# Patient Record
Sex: Female | Born: 1975 | Race: White | Hispanic: No | Marital: Married | Smoking: Former smoker
Health system: Southern US, Community
[De-identification: ages and names within clinical notes are randomized; demographics above are authoritative.]

## PROBLEM LIST (undated history)

## (undated) HISTORY — PX: NO PAST SURGERIES: SHX2092

---

## 2016-01-12 DIAGNOSIS — R69 Illness, unspecified: Secondary | ICD-10-CM | POA: Diagnosis not present

## 2016-01-12 DIAGNOSIS — E039 Hypothyroidism, unspecified: Secondary | ICD-10-CM | POA: Diagnosis not present

## 2016-04-12 DIAGNOSIS — E039 Hypothyroidism, unspecified: Secondary | ICD-10-CM | POA: Diagnosis not present

## 2016-04-12 DIAGNOSIS — Z23 Encounter for immunization: Secondary | ICD-10-CM | POA: Diagnosis not present

## 2016-04-12 DIAGNOSIS — Z6828 Body mass index (BMI) 28.0-28.9, adult: Secondary | ICD-10-CM | POA: Diagnosis not present

## 2016-04-12 DIAGNOSIS — R69 Illness, unspecified: Secondary | ICD-10-CM | POA: Diagnosis not present

## 2016-04-21 DIAGNOSIS — E063 Autoimmune thyroiditis: Secondary | ICD-10-CM | POA: Diagnosis not present

## 2016-04-21 DIAGNOSIS — R635 Abnormal weight gain: Secondary | ICD-10-CM | POA: Diagnosis not present

## 2016-04-21 DIAGNOSIS — E039 Hypothyroidism, unspecified: Secondary | ICD-10-CM | POA: Diagnosis not present

## 2016-04-21 DIAGNOSIS — R5383 Other fatigue: Secondary | ICD-10-CM | POA: Diagnosis not present

## 2016-04-27 DIAGNOSIS — J029 Acute pharyngitis, unspecified: Secondary | ICD-10-CM | POA: Diagnosis not present

## 2016-05-18 DIAGNOSIS — Z1231 Encounter for screening mammogram for malignant neoplasm of breast: Secondary | ICD-10-CM | POA: Diagnosis not present

## 2016-06-07 DIAGNOSIS — E039 Hypothyroidism, unspecified: Secondary | ICD-10-CM | POA: Diagnosis not present

## 2016-07-14 DIAGNOSIS — R41840 Attention and concentration deficit: Secondary | ICD-10-CM | POA: Diagnosis not present

## 2016-07-14 DIAGNOSIS — E039 Hypothyroidism, unspecified: Secondary | ICD-10-CM | POA: Diagnosis not present

## 2016-07-14 DIAGNOSIS — Z6829 Body mass index (BMI) 29.0-29.9, adult: Secondary | ICD-10-CM | POA: Diagnosis not present

## 2016-09-14 DIAGNOSIS — N920 Excessive and frequent menstruation with regular cycle: Secondary | ICD-10-CM | POA: Diagnosis not present

## 2016-09-14 DIAGNOSIS — R41840 Attention and concentration deficit: Secondary | ICD-10-CM | POA: Diagnosis not present

## 2016-09-14 DIAGNOSIS — E039 Hypothyroidism, unspecified: Secondary | ICD-10-CM | POA: Diagnosis not present

## 2016-11-26 DIAGNOSIS — R300 Dysuria: Secondary | ICD-10-CM | POA: Diagnosis not present

## 2016-11-26 DIAGNOSIS — M545 Low back pain: Secondary | ICD-10-CM | POA: Diagnosis not present

## 2017-02-21 DIAGNOSIS — R300 Dysuria: Secondary | ICD-10-CM | POA: Diagnosis not present

## 2017-02-21 DIAGNOSIS — N390 Urinary tract infection, site not specified: Secondary | ICD-10-CM | POA: Diagnosis not present

## 2017-03-22 DIAGNOSIS — Z20828 Contact with and (suspected) exposure to other viral communicable diseases: Secondary | ICD-10-CM | POA: Diagnosis not present

## 2017-03-22 DIAGNOSIS — R41840 Attention and concentration deficit: Secondary | ICD-10-CM | POA: Diagnosis not present

## 2017-03-22 DIAGNOSIS — R635 Abnormal weight gain: Secondary | ICD-10-CM | POA: Diagnosis not present

## 2017-03-22 DIAGNOSIS — E039 Hypothyroidism, unspecified: Secondary | ICD-10-CM | POA: Diagnosis not present

## 2017-03-22 DIAGNOSIS — W57XXXA Bitten or stung by nonvenomous insect and other nonvenomous arthropods, initial encounter: Secondary | ICD-10-CM | POA: Diagnosis not present

## 2017-04-03 DIAGNOSIS — J069 Acute upper respiratory infection, unspecified: Secondary | ICD-10-CM | POA: Diagnosis not present

## 2017-04-03 DIAGNOSIS — J0141 Acute recurrent pansinusitis: Secondary | ICD-10-CM | POA: Diagnosis not present

## 2017-04-03 DIAGNOSIS — R6889 Other general symptoms and signs: Secondary | ICD-10-CM | POA: Diagnosis not present

## 2017-04-03 DIAGNOSIS — J060 Acute laryngopharyngitis: Secondary | ICD-10-CM | POA: Diagnosis not present

## 2017-04-03 DIAGNOSIS — E039 Hypothyroidism, unspecified: Secondary | ICD-10-CM

## 2017-04-03 DIAGNOSIS — F988 Other specified behavioral and emotional disorders with onset usually occurring in childhood and adolescence: Secondary | ICD-10-CM

## 2017-04-03 HISTORY — DX: Hypothyroidism, unspecified: E03.9

## 2017-04-03 HISTORY — DX: Other specified behavioral and emotional disorders with onset usually occurring in childhood and adolescence: F98.8

## 2017-04-27 DIAGNOSIS — Z23 Encounter for immunization: Secondary | ICD-10-CM | POA: Diagnosis not present

## 2017-04-27 DIAGNOSIS — R41840 Attention and concentration deficit: Secondary | ICD-10-CM | POA: Diagnosis not present

## 2017-04-27 DIAGNOSIS — Z6834 Body mass index (BMI) 34.0-34.9, adult: Secondary | ICD-10-CM | POA: Diagnosis not present

## 2017-04-27 DIAGNOSIS — E039 Hypothyroidism, unspecified: Secondary | ICD-10-CM | POA: Diagnosis not present

## 2017-05-04 DIAGNOSIS — W57XXXA Bitten or stung by nonvenomous insect and other nonvenomous arthropods, initial encounter: Secondary | ICD-10-CM | POA: Diagnosis not present

## 2017-05-04 DIAGNOSIS — E039 Hypothyroidism, unspecified: Secondary | ICD-10-CM | POA: Diagnosis not present

## 2017-05-04 DIAGNOSIS — M2550 Pain in unspecified joint: Secondary | ICD-10-CM | POA: Diagnosis not present

## 2017-05-04 DIAGNOSIS — E559 Vitamin D deficiency, unspecified: Secondary | ICD-10-CM | POA: Diagnosis not present

## 2017-05-04 DIAGNOSIS — M791 Myalgia, unspecified site: Secondary | ICD-10-CM | POA: Diagnosis not present

## 2017-05-04 DIAGNOSIS — R635 Abnormal weight gain: Secondary | ICD-10-CM | POA: Diagnosis not present

## 2017-05-04 DIAGNOSIS — K589 Irritable bowel syndrome without diarrhea: Secondary | ICD-10-CM | POA: Diagnosis not present

## 2017-05-04 DIAGNOSIS — G43909 Migraine, unspecified, not intractable, without status migrainosus: Secondary | ICD-10-CM | POA: Diagnosis not present

## 2017-05-04 DIAGNOSIS — E063 Autoimmune thyroiditis: Secondary | ICD-10-CM | POA: Diagnosis not present

## 2017-05-12 DIAGNOSIS — Z01419 Encounter for gynecological examination (general) (routine) without abnormal findings: Secondary | ICD-10-CM | POA: Diagnosis not present

## 2017-05-12 DIAGNOSIS — N760 Acute vaginitis: Secondary | ICD-10-CM | POA: Diagnosis not present

## 2017-05-12 DIAGNOSIS — Z1331 Encounter for screening for depression: Secondary | ICD-10-CM | POA: Diagnosis not present

## 2017-05-12 DIAGNOSIS — Z1389 Encounter for screening for other disorder: Secondary | ICD-10-CM | POA: Diagnosis not present

## 2017-05-12 DIAGNOSIS — E039 Hypothyroidism, unspecified: Secondary | ICD-10-CM | POA: Diagnosis not present

## 2017-05-12 DIAGNOSIS — Z1211 Encounter for screening for malignant neoplasm of colon: Secondary | ICD-10-CM | POA: Diagnosis not present

## 2017-05-22 DIAGNOSIS — S9032XA Contusion of left foot, initial encounter: Secondary | ICD-10-CM | POA: Diagnosis not present

## 2017-05-22 DIAGNOSIS — S99922A Unspecified injury of left foot, initial encounter: Secondary | ICD-10-CM | POA: Diagnosis not present

## 2017-05-22 DIAGNOSIS — M79672 Pain in left foot: Secondary | ICD-10-CM | POA: Diagnosis not present

## 2017-05-22 DIAGNOSIS — S9782XA Crushing injury of left foot, initial encounter: Secondary | ICD-10-CM | POA: Diagnosis not present

## 2017-05-30 DIAGNOSIS — Z1231 Encounter for screening mammogram for malignant neoplasm of breast: Secondary | ICD-10-CM | POA: Diagnosis not present

## 2017-06-06 DIAGNOSIS — G43909 Migraine, unspecified, not intractable, without status migrainosus: Secondary | ICD-10-CM | POA: Diagnosis not present

## 2017-06-06 DIAGNOSIS — R635 Abnormal weight gain: Secondary | ICD-10-CM | POA: Diagnosis not present

## 2017-06-06 DIAGNOSIS — E039 Hypothyroidism, unspecified: Secondary | ICD-10-CM | POA: Diagnosis not present

## 2017-06-06 DIAGNOSIS — E538 Deficiency of other specified B group vitamins: Secondary | ICD-10-CM | POA: Diagnosis not present

## 2017-06-06 DIAGNOSIS — K589 Irritable bowel syndrome without diarrhea: Secondary | ICD-10-CM | POA: Diagnosis not present

## 2017-06-06 DIAGNOSIS — M2550 Pain in unspecified joint: Secondary | ICD-10-CM | POA: Diagnosis not present

## 2017-06-06 DIAGNOSIS — R7989 Other specified abnormal findings of blood chemistry: Secondary | ICD-10-CM | POA: Diagnosis not present

## 2017-06-06 DIAGNOSIS — M791 Myalgia, unspecified site: Secondary | ICD-10-CM | POA: Diagnosis not present

## 2017-06-06 DIAGNOSIS — W57XXXA Bitten or stung by nonvenomous insect and other nonvenomous arthropods, initial encounter: Secondary | ICD-10-CM | POA: Diagnosis not present

## 2017-06-06 DIAGNOSIS — E063 Autoimmune thyroiditis: Secondary | ICD-10-CM | POA: Diagnosis not present

## 2017-09-05 DIAGNOSIS — E559 Vitamin D deficiency, unspecified: Secondary | ICD-10-CM | POA: Diagnosis not present

## 2017-09-05 DIAGNOSIS — E039 Hypothyroidism, unspecified: Secondary | ICD-10-CM | POA: Diagnosis not present

## 2017-09-05 DIAGNOSIS — R7989 Other specified abnormal findings of blood chemistry: Secondary | ICD-10-CM | POA: Diagnosis not present

## 2017-09-05 DIAGNOSIS — E063 Autoimmune thyroiditis: Secondary | ICD-10-CM | POA: Diagnosis not present

## 2017-09-05 DIAGNOSIS — E538 Deficiency of other specified B group vitamins: Secondary | ICD-10-CM | POA: Diagnosis not present

## 2017-09-05 DIAGNOSIS — M2550 Pain in unspecified joint: Secondary | ICD-10-CM | POA: Diagnosis not present

## 2017-09-05 DIAGNOSIS — M791 Myalgia, unspecified site: Secondary | ICD-10-CM | POA: Diagnosis not present

## 2017-09-05 DIAGNOSIS — K589 Irritable bowel syndrome without diarrhea: Secondary | ICD-10-CM | POA: Diagnosis not present

## 2017-09-05 DIAGNOSIS — G43909 Migraine, unspecified, not intractable, without status migrainosus: Secondary | ICD-10-CM | POA: Diagnosis not present

## 2017-09-22 DIAGNOSIS — R41840 Attention and concentration deficit: Secondary | ICD-10-CM | POA: Diagnosis not present

## 2017-09-22 DIAGNOSIS — G43909 Migraine, unspecified, not intractable, without status migrainosus: Secondary | ICD-10-CM | POA: Diagnosis not present

## 2017-09-22 DIAGNOSIS — Z30432 Encounter for removal of intrauterine contraceptive device: Secondary | ICD-10-CM | POA: Diagnosis not present

## 2017-11-20 DIAGNOSIS — E538 Deficiency of other specified B group vitamins: Secondary | ICD-10-CM | POA: Diagnosis not present

## 2017-11-20 DIAGNOSIS — G43909 Migraine, unspecified, not intractable, without status migrainosus: Secondary | ICD-10-CM | POA: Diagnosis not present

## 2017-11-20 DIAGNOSIS — E039 Hypothyroidism, unspecified: Secondary | ICD-10-CM | POA: Diagnosis not present

## 2017-11-20 DIAGNOSIS — R7989 Other specified abnormal findings of blood chemistry: Secondary | ICD-10-CM | POA: Diagnosis not present

## 2017-11-20 DIAGNOSIS — E559 Vitamin D deficiency, unspecified: Secondary | ICD-10-CM | POA: Diagnosis not present

## 2017-11-20 DIAGNOSIS — E063 Autoimmune thyroiditis: Secondary | ICD-10-CM | POA: Diagnosis not present

## 2017-12-04 DIAGNOSIS — R635 Abnormal weight gain: Secondary | ICD-10-CM | POA: Diagnosis not present

## 2017-12-04 DIAGNOSIS — G43909 Migraine, unspecified, not intractable, without status migrainosus: Secondary | ICD-10-CM | POA: Diagnosis not present

## 2017-12-04 DIAGNOSIS — R7989 Other specified abnormal findings of blood chemistry: Secondary | ICD-10-CM | POA: Diagnosis not present

## 2017-12-04 DIAGNOSIS — W57XXXA Bitten or stung by nonvenomous insect and other nonvenomous arthropods, initial encounter: Secondary | ICD-10-CM | POA: Diagnosis not present

## 2017-12-04 DIAGNOSIS — M791 Myalgia, unspecified site: Secondary | ICD-10-CM | POA: Diagnosis not present

## 2017-12-04 DIAGNOSIS — K589 Irritable bowel syndrome without diarrhea: Secondary | ICD-10-CM | POA: Diagnosis not present

## 2017-12-04 DIAGNOSIS — E538 Deficiency of other specified B group vitamins: Secondary | ICD-10-CM | POA: Diagnosis not present

## 2017-12-04 DIAGNOSIS — E039 Hypothyroidism, unspecified: Secondary | ICD-10-CM | POA: Diagnosis not present

## 2017-12-04 DIAGNOSIS — M2550 Pain in unspecified joint: Secondary | ICD-10-CM | POA: Diagnosis not present

## 2017-12-04 DIAGNOSIS — E063 Autoimmune thyroiditis: Secondary | ICD-10-CM | POA: Diagnosis not present

## 2018-05-16 DIAGNOSIS — Z23 Encounter for immunization: Secondary | ICD-10-CM | POA: Diagnosis not present

## 2018-05-29 DIAGNOSIS — E063 Autoimmune thyroiditis: Secondary | ICD-10-CM | POA: Diagnosis not present

## 2018-05-29 DIAGNOSIS — G43909 Migraine, unspecified, not intractable, without status migrainosus: Secondary | ICD-10-CM | POA: Diagnosis not present

## 2018-05-29 DIAGNOSIS — E039 Hypothyroidism, unspecified: Secondary | ICD-10-CM | POA: Diagnosis not present

## 2018-05-29 DIAGNOSIS — R7989 Other specified abnormal findings of blood chemistry: Secondary | ICD-10-CM | POA: Diagnosis not present

## 2018-05-29 DIAGNOSIS — E538 Deficiency of other specified B group vitamins: Secondary | ICD-10-CM | POA: Diagnosis not present

## 2018-06-06 DIAGNOSIS — E063 Autoimmune thyroiditis: Secondary | ICD-10-CM | POA: Diagnosis not present

## 2018-06-06 DIAGNOSIS — E538 Deficiency of other specified B group vitamins: Secondary | ICD-10-CM | POA: Diagnosis not present

## 2018-06-06 DIAGNOSIS — M791 Myalgia, unspecified site: Secondary | ICD-10-CM | POA: Diagnosis not present

## 2018-06-06 DIAGNOSIS — G43909 Migraine, unspecified, not intractable, without status migrainosus: Secondary | ICD-10-CM | POA: Diagnosis not present

## 2018-06-06 DIAGNOSIS — E039 Hypothyroidism, unspecified: Secondary | ICD-10-CM | POA: Diagnosis not present

## 2018-06-06 DIAGNOSIS — K589 Irritable bowel syndrome without diarrhea: Secondary | ICD-10-CM | POA: Diagnosis not present

## 2018-06-06 DIAGNOSIS — R7989 Other specified abnormal findings of blood chemistry: Secondary | ICD-10-CM | POA: Diagnosis not present

## 2018-06-06 DIAGNOSIS — W57XXXA Bitten or stung by nonvenomous insect and other nonvenomous arthropods, initial encounter: Secondary | ICD-10-CM | POA: Diagnosis not present

## 2018-06-06 DIAGNOSIS — R635 Abnormal weight gain: Secondary | ICD-10-CM | POA: Diagnosis not present

## 2018-06-06 DIAGNOSIS — M2550 Pain in unspecified joint: Secondary | ICD-10-CM | POA: Diagnosis not present

## 2018-08-10 DIAGNOSIS — N912 Amenorrhea, unspecified: Secondary | ICD-10-CM | POA: Diagnosis not present

## 2018-08-10 DIAGNOSIS — N926 Irregular menstruation, unspecified: Secondary | ICD-10-CM | POA: Diagnosis not present

## 2018-08-10 DIAGNOSIS — E559 Vitamin D deficiency, unspecified: Secondary | ICD-10-CM | POA: Diagnosis not present

## 2018-08-10 DIAGNOSIS — R41840 Attention and concentration deficit: Secondary | ICD-10-CM | POA: Diagnosis not present

## 2018-08-10 DIAGNOSIS — Z13220 Encounter for screening for lipoid disorders: Secondary | ICD-10-CM | POA: Diagnosis not present

## 2019-04-30 ENCOUNTER — Ambulatory Visit
Admission: RE | Admit: 2019-04-30 | Discharge: 2019-04-30 | Disposition: A | Discharge status: Home / Self Care | Payer: 59 | Source: Ambulatory Visit | Attending: Family Medicine | Admitting: Family Medicine

## 2019-04-30 ENCOUNTER — Other Ambulatory Visit: Payer: Self-pay

## 2019-04-30 DIAGNOSIS — R509 Fever, unspecified: Secondary | ICD-10-CM

## 2020-02-28 ENCOUNTER — Emergency Department (HOSPITAL_COMMUNITY): Payer: 59

## 2020-02-28 ENCOUNTER — Emergency Department (HOSPITAL_COMMUNITY)
Admission: EM | Admit: 2020-02-28 | Discharge: 2020-02-29 | Disposition: A | Discharge status: Home / Self Care | Payer: 59 | Attending: Emergency Medicine | Admitting: Emergency Medicine

## 2020-02-28 ENCOUNTER — Other Ambulatory Visit: Payer: Self-pay

## 2020-02-28 ENCOUNTER — Encounter (HOSPITAL_COMMUNITY): Payer: Self-pay

## 2020-02-28 DIAGNOSIS — Z7982 Long term (current) use of aspirin: Secondary | ICD-10-CM | POA: Insufficient documentation

## 2020-02-28 DIAGNOSIS — R0602 Shortness of breath: Secondary | ICD-10-CM | POA: Diagnosis not present

## 2020-02-28 DIAGNOSIS — Z79899 Other long term (current) drug therapy: Secondary | ICD-10-CM | POA: Diagnosis not present

## 2020-02-28 DIAGNOSIS — R600 Localized edema: Secondary | ICD-10-CM | POA: Diagnosis not present

## 2020-02-28 DIAGNOSIS — Z86711 Personal history of pulmonary embolism: Secondary | ICD-10-CM | POA: Diagnosis not present

## 2020-02-28 DIAGNOSIS — R2243 Localized swelling, mass and lump, lower limb, bilateral: Secondary | ICD-10-CM | POA: Diagnosis present

## 2020-02-28 DIAGNOSIS — R791 Abnormal coagulation profile: Secondary | ICD-10-CM | POA: Insufficient documentation

## 2020-02-28 LAB — I-STAT CHEM 8, ED
BUN: 16 mg/dL (ref 6–20)
Calcium, Ion: 1.22 mmol/L (ref 1.15–1.40)
Chloride: 103 mmol/L (ref 98–111)
Creatinine, Ser: 1.2 mg/dL — ABNORMAL HIGH (ref 0.44–1.00)
Glucose, Bld: 100 mg/dL — ABNORMAL HIGH (ref 70–99)
HCT: 33 % — ABNORMAL LOW (ref 36.0–46.0)
Hemoglobin: 11.2 g/dL — ABNORMAL LOW (ref 12.0–15.0)
Potassium: 3.5 mmol/L (ref 3.5–5.1)
Sodium: 140 mmol/L (ref 135–145)
TCO2: 22 mmol/L (ref 22–32)

## 2020-02-28 LAB — CBC
HCT: 36.3 % (ref 36.0–46.0)
Hemoglobin: 11.2 g/dL — ABNORMAL LOW (ref 12.0–15.0)
MCH: 26.2 pg (ref 26.0–34.0)
MCHC: 30.9 g/dL (ref 30.0–36.0)
MCV: 85 fL (ref 80.0–100.0)
Platelets: 440 10*3/uL — ABNORMAL HIGH (ref 150–400)
RBC: 4.27 MIL/uL (ref 3.87–5.11)
RDW: 13.9 % (ref 11.5–15.5)
WBC: 8.9 10*3/uL (ref 4.0–10.5)
nRBC: 0 % (ref 0.0–0.2)

## 2020-02-28 LAB — COMPREHENSIVE METABOLIC PANEL
ALT: 12 U/L (ref 0–44)
AST: 22 U/L (ref 15–41)
Albumin: 2.6 g/dL — ABNORMAL LOW (ref 3.5–5.0)
Alkaline Phosphatase: 77 U/L (ref 38–126)
Anion gap: 7 (ref 5–15)
BUN: 15 mg/dL (ref 6–20)
CO2: 24 mmol/L (ref 22–32)
Calcium: 8.7 mg/dL — ABNORMAL LOW (ref 8.9–10.3)
Chloride: 107 mmol/L (ref 98–111)
Creatinine, Ser: 1.3 mg/dL — ABNORMAL HIGH (ref 0.44–1.00)
GFR calc Af Amer: 57 mL/min — ABNORMAL LOW (ref >60)
GFR calc non Af Amer: 50 mL/min — ABNORMAL LOW (ref >60)
Glucose, Bld: 106 mg/dL — ABNORMAL HIGH (ref 70–99)
Potassium: 3.6 mmol/L (ref 3.5–5.1)
Sodium: 138 mmol/L (ref 135–145)
Total Bilirubin: 0.8 mg/dL (ref 0.3–1.2)
Total Protein: 5 g/dL — ABNORMAL LOW (ref 6.5–8.1)

## 2020-02-28 LAB — I-STAT BETA HCG BLOOD, ED (MC, WL, AP ONLY): I-stat hCG, quantitative: <5 m[IU]/mL (ref <5)

## 2020-02-28 MED ORDER — IOHEXOL 350 MG/ML SOLN
75.0000 mL | Freq: Once | INTRAVENOUS | Status: AC | PRN
  Administered 2020-02-28: 75 mL via INTRAVENOUS

## 2020-02-28 NOTE — ED Triage Notes (Signed)
Pt arrives POV for eval of SOB, blt LE edema x3-4 days. Seen at PCP yesterday and given diuretic, PCP called today and advised positive D- dimer of 2.69. No hx of blood clots/DVTs.

## 2020-02-29 ENCOUNTER — Emergency Department (HOSPITAL_BASED_OUTPATIENT_CLINIC_OR_DEPARTMENT_OTHER): Payer: 59

## 2020-02-29 ENCOUNTER — Encounter (HOSPITAL_COMMUNITY): Payer: 59

## 2020-02-29 DIAGNOSIS — M7989 Other specified soft tissue disorders: Secondary | ICD-10-CM | POA: Diagnosis not present

## 2020-02-29 LAB — BRAIN NATRIURETIC PEPTIDE: B Natriuretic Peptide: 21.3 pg/mL (ref 0.0–100.0)

## 2020-02-29 MED ORDER — FUROSEMIDE 10 MG/ML IJ SOLN
40.0000 mg | Freq: Once | INTRAMUSCULAR | Status: AC
  Administered 2020-02-29: 40 mg via INTRAVENOUS
  Filled 2020-02-29: qty 4

## 2020-02-29 NOTE — Discharge Instructions (Signed)
Begin taking the Lasix as prescribed by your primary doctor.  Follow-up with cardiology in the next week.  The contact information for the cardiology clinic on [facility] has been provided in this discharge summary for you to call and make these arrangements.  Return to the ER if you develop chest pain, worsening breathing, high fever, or other new and concerning symptoms.

## 2020-02-29 NOTE — Progress Notes (Signed)
VASCULAR LAB    Bilateral lower extremity venous duplex completed.    Preliminary report:  See CV proc for preliminary results.  Messaged Dr. Judd Lien with results.  Teal Bontrager, RVT 02/29/2020, 8:22 AM

## 2020-02-29 NOTE — ED Provider Notes (Signed)
[facility] EMERGENCY DEPARTMENT Provider Note   CSN: [id number] Arrival date & time: 02/28/20  1739     History Chief Complaint  Patient presents with   Shortness of Breath   Abnormal Lab    Hayley Mason is a 44 y.o. female.  Patient is a 44 year old female with no significant past medical history.  She presents today for evaluation of bilateral leg swelling.  This has been worsening over the past several days.  She was seen by her primary doctor yesterday and prescribed furosemide and had laboratory studies obtained.  Patient was found to have an elevated D-dimer and was referred here for further work-up.  Patient denies prior history of DVT or PE.  She denies chest pain but does feel somewhat short of breath.  The history is provided by the patient.       History reviewed. No pertinent past medical history.  There are no problems to display for this patient.     OB History   No obstetric history on file.     History reviewed. No pertinent family history.  Social History   Tobacco Use   Smoking status: Not on file  Substance Use Topics   Alcohol use: Not on file   Drug use: Not on file    Home Medications Prior to Admission medications   Medication Sig Start Date End Date Taking? Authorizing Provider  acetaminophen (TYLENOL) 500 MG tablet Take 500 mg by mouth every 6 (six) hours as needed for mild pain.   Yes [provider]  Acetylcysteine 600 MG CAPS Take 1 capsule by mouth every other day.   Yes [provider]  amphetamine-dextroamphetamine (ADDERALL) 20 MG tablet Take 20 mg by mouth 2 (two) times daily.   Yes [provider]  aspirin EC 81 MG tablet Take 81 mg by mouth daily. Swallow whole.   Yes [provider]  cetirizine (ZYRTEC) 10 MG tablet Take 10 mg by mouth every evening.   Yes [provider]  diphenhydrAMINE (BENADRYL) 25 MG tablet Take 25 mg by mouth every 6 (six) hours as  needed (Allergic reaction).   Yes [provider]  ibuprofen (ADVIL) 200 MG tablet Take 200 mg by mouth every 6 (six) hours as needed for moderate pain.   Yes [provider]  Naltrexone POWD Take 1 tablet by mouth every evening. 2 mg tablet per patient   Yes [provider]  thyroid (ARMOUR) 90 MG tablet Take 90 mg by mouth daily.   Yes [provider]  topiramate (TOPAMAX) 25 MG capsule Take 25 mg by mouth every evening.   Yes [provider]  Vitamin D-Vitamin K (VITAMIN K2-VITAMIN D3) 45-2000 MCG-UNIT CAPS Take 1 tablet by mouth every evening.   Yes [provider]    Allergies    Codeine  Review of Systems   Review of Systems  All other systems reviewed and are negative.   Physical Exam Updated Vital Signs BP 96/63 (BP Location: Left Arm)    Pulse 90    Temp 98.5 F (36.9 C) (Oral)    Resp 16    Ht 5\' 3"  (1.6 m)    Wt 90.7 kg    SpO2 100%    BMI 35.43 kg/m   Physical Exam Vitals and nursing note reviewed.  Constitutional:      General: She is not in acute distress.    Appearance: She is well-developed. She is not diaphoretic.  HENT:  Head: Normocephalic and atraumatic.  Cardiovascular:     Rate and Rhythm: Normal rate and regular rhythm.     Heart sounds: No murmur heard.  No friction rub. No gallop.   Pulmonary:     Effort: Pulmonary effort is normal. No respiratory distress.     Breath sounds: Normal breath sounds. No wheezing.  Abdominal:     General: Bowel sounds are normal. There is no distension.     Palpations: Abdomen is soft.     Tenderness: There is no abdominal tenderness.  Musculoskeletal:        General: Normal range of motion.     Cervical back: Normal range of motion and neck supple.     Right lower leg: Edema present.     Left lower leg: Edema present.     Comments: There is 1-2+ pitting edema of both lower extremities.  There is no calf tenderness.  Denna Haggard' sign is absent.  Skin:    General:  Skin is warm and dry.  Neurological:     Mental Status: She is alert and oriented to person, place, and time.     ED Results / Procedures / Treatments   Labs (all labs ordered are listed, but only abnormal results are displayed) Labs Reviewed  CBC - Abnormal; Notable for the following components:      Result Value   Hemoglobin 11.2 (*)    Platelets 440 (*)    All other components within normal limits  COMPREHENSIVE METABOLIC PANEL - Abnormal; Notable for the following components:   Glucose, Bld 106 (*)    Creatinine, Ser 1.30 (*)    Calcium 8.7 (*)    Total Protein 5.0 (*)    Albumin 2.6 (*)    GFR calc non Af Amer 50 (*)    GFR calc Af Amer 57 (*)    All other components within normal limits  I-STAT CHEM 8, ED - Abnormal; Notable for the following components:   Creatinine, Ser 1.20 (*)    Glucose, Bld 100 (*)    Hemoglobin 11.2 (*)    HCT 33.0 (*)    All other components within normal limits  I-STAT BETA HCG BLOOD, ED ([facility], [facility], [facility] ONLY)    EKG EKG Interpretation  Date/Time:  Friday February 28 2020 18:07:08 EDT Ventricular Rate:  100 PR Interval:  128 QRS Duration: 70 QT Interval:  338 QTC Calculation: 436 R Axis:   48 Text Interpretation: Normal sinus rhythm Low voltage QRS Septal infarct , age undetermined Abnormal ECG No previous tracing Confirmed by Tilden Fossa [phone]) on 02/28/2020 11:34:25 PM   Radiology CT Angio Chest PE W and/or Wo Contrast  Result Date: 02/28/2020 CLINICAL DATA:  Positive D-dimer EXAM: CT ANGIOGRAPHY CHEST WITH CONTRAST TECHNIQUE: Multidetector CT imaging of the chest was performed using the standard protocol during bolus administration of intravenous contrast. Multiplanar CT image reconstructions and MIPs were obtained to evaluate the vascular anatomy. CONTRAST:  76mL OMNIPAQUE IOHEXOL 350 MG/ML SOLN COMPARISON:  None. FINDINGS: Cardiovascular: No filling defects in the pulmonary arteries to suggest pulmonary emboli. Heart is normal size.  Aorta is normal caliber. Mediastinum/Nodes: No mediastinal, hilar, or axillary adenopathy. Trachea and esophagus are unremarkable. Thyroid unremarkable. Lungs/Pleura: Lungs are clear. No focal airspace opacities or suspicious nodules. No effusions. Upper Abdomen: Imaging into the upper abdomen demonstrates no acute findings. Musculoskeletal: Chest wall soft tissues are unremarkable. No acute bony abnormality. Review of the MIP images confirms the above findings. IMPRESSION: No evidence of pulmonary  embolus. No acute cardiopulmonary disease. Electronically Signed   By: Charlett Nose M.D.   On: 02/28/2020 19:48    Procedures Procedures (including critical care time)  Medications Ordered in ED Medications  furosemide (LASIX) injection 40 mg (has no administration in time range)  iohexol (OMNIPAQUE) 350 MG/ML injection 75 mL (75 mLs Intravenous Contrast Given 02/28/20 1942)    ED Course  I have reviewed the triage vital signs and the nursing notes.  Pertinent labs & imaging results that were available during my care of the patient were reviewed by me and considered in my medical decision making (see chart for details).    MDM Rules/Calculators/A&P  Patient is a 44 year old female presenting with complaints of edema and elevated D-dimer.  She was seen by her primary doctor yesterday for shortness of breath and swelling.  She was informed of her positive D-dimer and told to go to the ER for rule out of blood clot.  Patient's CTA of the chest is negative for PE and ultrasound studies of the lower extremities is negative for DVT.  Her laboratory studies are unremarkable including normal BNP and renal function.  Hemoglobin is 11.2.  Patient does have some edema on exam, the etiology of which I am uncertain.  She was given 40 of IV Lasix with a good diuresis.  At this point, her vital signs are stable and oxygen saturation is 100%.  I feel like I have ruled out emergent pathology at this point.  There  is no sign of congestive heart failure on her CT scan or BNP and no sign of venous thrombosis on imaging studies.  Patient will be discharged and advised to use the Lasix prescribed by her primary doctor.  She will also be referred to cardiology as an echocardiogram may be of benefit.  Final Clinical Impression(s) / ED Diagnoses Final diagnoses:  None    Rx / DC Orders ED Discharge Orders    None       Geoffery Lyons, MD 02/29/20 [id number]

## 2020-02-29 NOTE — ED Notes (Signed)
Transferred to Vascular Lab

## 2020-02-29 NOTE — ED Notes (Signed)
Patient transported to VAS 

## 2020-03-15 DIAGNOSIS — U071 COVID-19: Secondary | ICD-10-CM | POA: Insufficient documentation

## 2020-03-15 HISTORY — DX: COVID-19: U07.1

## 2020-03-20 ENCOUNTER — Ambulatory Visit: Payer: 59 | Admitting: Cardiology

## 2020-04-06 ENCOUNTER — Ambulatory Visit (INDEPENDENT_AMBULATORY_CARE_PROVIDER_SITE_OTHER): Payer: 59 | Admitting: Cardiology

## 2020-04-06 ENCOUNTER — Encounter: Payer: Self-pay | Admitting: Cardiology

## 2020-04-06 ENCOUNTER — Other Ambulatory Visit: Payer: Self-pay

## 2020-04-06 DIAGNOSIS — R0600 Dyspnea, unspecified: Secondary | ICD-10-CM

## 2020-04-06 DIAGNOSIS — E785 Hyperlipidemia, unspecified: Secondary | ICD-10-CM

## 2020-04-06 DIAGNOSIS — M7989 Other specified soft tissue disorders: Secondary | ICD-10-CM | POA: Insufficient documentation

## 2020-04-06 DIAGNOSIS — R0609 Other forms of dyspnea: Secondary | ICD-10-CM

## 2020-04-06 HISTORY — DX: Other specified soft tissue disorders: M79.89

## 2020-04-06 HISTORY — DX: Hyperlipidemia, unspecified: E78.5

## 2020-04-06 HISTORY — DX: Other forms of dyspnea: R06.09

## 2020-04-06 HISTORY — DX: Dyspnea, unspecified: R06.00

## 2020-04-06 NOTE — Progress Notes (Signed)
Cardiology Consultation:    Date:  04/06/2020   ID:  Hayley Mason, Hayley Mason, Hayley Mason  PCP:  Hayley Ponto, Hayley Mason  Cardiologist:  Hayley Balsam, Hayley Mason   Referring Hayley Mason: Hayley Ponto, Hayley Mason   No chief complaint on file. I have swollen legs  History of Present Illness:    Hayley Mason is a 44 y.o. female who is being seen today for the evaluation of leg swelling at the request of Hayley Ponto, Hayley Mason.  About a month ago she suffered from COVID-19 infection.  She has been vaccinated both her as well as her husband both of them became sick with COVID-19.  In about a month ago she did notice swelling of lower extremities worse at evening time.  She does notice also some shortness of breath and fatigue.  Denies have any chest pain tightness squeezing pressure mid chest no palpitations.  She also said when she was pregnant 16 years ago with her child she had many times situation when she was getting up she was getting very dizzy and she was find to have low blood pressure.  Never had any heart problem.  She is quit smoking years ago, used to exercise on the regular basis running on the regular basis but within the last for 5 years she does not plan.  Now she said walking for short distance will give her shortness of breath and fatigue.  Past Medical History:  Diagnosis Date  . Acquired hypothyroidism 04/03/2017  . 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 COVID-19 03/15/2020   Last Assessment & Plan:  Formatting of this note might be different from the original. Mab infusion consent done.    Past Surgical History:  Procedure Laterality Date  . NO PAST SURGERIES      Current Medications: Current Meds  Medication Sig  . acetaminophen (TYLENOL) 500 MG tablet Take 500 mg by mouth every 6 (six) hours as needed for mild pain.  Marland Kitchen aspirin EC 81 MG tablet Take 81 mg by mouth daily. Swallow whole.  . cetirizine (ZYRTEC) 10 MG tablet Take 10 mg by mouth  every evening.  . diphenhydrAMINE (BENADRYL) 25 MG tablet Take 25 mg by mouth every 6 (six) hours as needed (Allergic reaction).  . furosemide (LASIX) 20 MG tablet Take 20 mg by mouth. 1- 2 times a day  . ibuprofen (ADVIL) 200 MG tablet Take 200 mg by mouth every 6 (six) hours as needed for moderate pain.  Marland Kitchen liothyronine (CYTOMEL) 5 MCG tablet Take 15 mcg by mouth daily.  . Naltrexone POWD Take 1 tablet by mouth every evening. 2 mg tablet per patient  . ondansetron (ZOFRAN-ODT) 4 MG disintegrating tablet Take 4 mg by mouth as needed.  Marland Kitchen TIROSINT 75 MCG CAPS Take 75 mcg by mouth every morning.  . topiramate (TOPAMAX) 25 MG capsule Take 25 mg by mouth every evening.  . Vitamin D-Vitamin K (VITAMIN K2-VITAMIN D3) 45-2000 MCG-UNIT CAPS Take 1 tablet by mouth every evening.     Allergies:   Codeine   Social History   Socioeconomic History  . Marital status: Married    Spouse name: Not on file  . Number of children: Not on file  . Years of education: Not on file  . Highest education level: Not on file  Occupational History  . Not on file  Tobacco Use  . Smoking status: Former Games developer  . Smokeless tobacco: Never Used  Substance and Sexual Activity  .  Alcohol use: Not on file  . Drug use: Not on file  . Sexual activity: Not on file  Other Topics Concern  . Not on file  Social History Narrative  . Not on file   Social Determinants of Health   Financial Resource Strain:   . Difficulty of Paying Living Expenses: Not on file  Food Insecurity:   . Worried About Programme researcher, broadcasting/film/video in the Last Year: Not on file  . Ran Out of Food in the Last Year: Not on file  Transportation Needs:   . Lack of Transportation (Medical): Not on file  . Lack of Transportation (Non-Medical): Not on file  Physical Activity:   . Days of Exercise per Week: Not on file  . Minutes of Exercise per Session: Not on file  Stress:   . Feeling of Stress : Not on file  Social Connections:   . Frequency of  Communication with Friends and Family: Not on file  . Frequency of Social Gatherings with Friends and Family: Not on file  . Attends Religious Services: Not on file  . Active Member of Clubs or Organizations: Not on file  . Attends Banker Meetings: Not on file  . Marital Status: Not on file     Family History: The patient's family history includes Breast cancer in her sister; Congestive Heart Failure in her maternal grandfather; Hypertension in her mother. ROS:   Please see the history of present illness.    All 14 point review of systems negative except as described per history of present illness.  EKGs/Labs/Other Studies Reviewed:    The following studies were reviewed today:   EKG:  EKG is  ordered today.  The ekg ordered today demonstrates normal sinus rhythm rate ninety-nine, low voltage, cannot rule out septal MI, nonspecific ST segment changes  She did have D-dimer which was abnormal, her proBNP was also mildly elevated.  Recent Labs: 02/28/2020: ALT 12; BUN 16; Creatinine, Ser 1.20; Hemoglobin 11.2; Platelets 440; Potassium 3.5; Sodium 140 02/29/2020: B Natriuretic Peptide 21.3  Recent Lipid Panel No results found for: CHOL, TRIG, HDL, CHOLHDL, VLDL, LDLCALC, LDLDIRECT  Physical Exam:    VS:  BP 118/72   Pulse 99   Ht 5' 3.5" (1.613 m)   Wt 194 lb (88 kg)   SpO2 97%   BMI 33.83 kg/m     Wt Readings from Last 3 Encounters:  04/06/20 194 lb (88 kg)  02/28/20 200 lb (90.7 kg)     GEN:  Well nourished, well developed in no acute distress HEENT: Normal NECK: No JVD; No carotid bruits LYMPHATICS: No lymphadenopathy CARDIAC: RRR, no murmurs, no rubs, no gallops RESPIRATORY:  Clear to auscultation without rales, wheezing or rhonchi  ABDOMEN: Soft, non-tender, non-distended MUSCULOSKELETAL: 1+ pitting edema; No deformity  SKIN: Warm and dry NEUROLOGIC:  Alert and oriented x 3 PSYCHIATRIC:  Normal affect   ASSESSMENT:    1. Dyspnea on exertion   2.  Swelling of lower extremity   3. Dyslipidemia    PLAN:    In order of problems listed above:  1. Dyspnea on exertion with swelling of lower extremities.  She does have some issue with thyroid that is being corrected lately, also she did have a recent COVID-19 infection, all those symptoms can be related to above-mentioned problems.  However obviously congestive heart failure need to be investigated.  She does have abnormality of the proBNP.  She will be scheduled to have an echocardiogram to assess  left ventricle ejection fraction. 2. Swelling of lower extremities D-dimer abnormal she did have venous duplex of lower extremity which showed no evidence of DVT, she also had CT of her chest showing no evidence of PE. 3. Dyslipidemia: We will call primary care physician to get her fasting lipid profile so we can calculate the risk and eventually do appropriate management.  Overall she does have constellation of some concerning symptoms however all those can be related to recent COVID-19 infection as well as hypothyroidism.  We will get echocardiogram to assess left ventricle ejection fraction.  I will not initiate any medication until diagnosis will be more clear.   Medication Adjustments/Labs and Tests Ordered: Current medicines are reviewed at length with the patient today.  Concerns regarding medicines are outlined above.  No orders of the defined types were placed in this encounter.  No orders of the defined types were placed in this encounter.   Signed, Georgeanna Lea, Hayley Mason, [facility]. 04/06/2020 10:18 AM    [facility]

## 2020-04-06 NOTE — Patient Instructions (Signed)
Medication Instructions:  Your physician recommends that you continue on your current medications as directed. Please refer to the Current Medication list given to you today.  *If you need a refill on your cardiac medications before your next appointment, please call your pharmacy*   Lab Work: None   If you have labs (blood work) drawn today and your tests are completely normal, you will receive your results only by: . MyChart Message (if you have MyChart) OR . A paper copy in the mail If you have any lab test that is abnormal or we need to change your treatment, we will call you to review the results.   Testing/Procedures:  Your physician has requested that you have an echocardiogram. Echocardiography is a painless test that uses sound waves to create images of your heart. It provides your doctor with information about the size and shape of your heart and how well your heart's chambers and valves are working. This procedure takes approximately one hour. There are no restrictions for this procedure.      Follow-Up: At [facility], you and your health needs are our priority.  As part of our continuing mission to provide you with exceptional heart care, we have created designated Provider Care Teams.  These Care Teams include your primary Cardiologist (physician) and Advanced Practice Providers (APPs -  Physician Assistants and Nurse Practitioners) who all work together to provide you with the care you need, when you need it.  We recommend signing up for the patient portal called "MyChart".  Sign up information is provided on this After Visit Summary.  MyChart is used to connect with patients for Virtual Visits (Telemedicine).  Patients are able to view lab/test results, encounter notes, upcoming appointments, etc.  Non-urgent messages can be sent to your provider as well.   To learn more about what you can do with MyChart, go to [email or web address].    Your next appointment:   2  month(s)  The format for your next appointment:   In Person  Provider:   Robert Krasowski, MD   Other Instructions   Echocardiogram An echocardiogram is a procedure that uses painless sound waves (ultrasound) to produce an image of the heart. Images from an echocardiogram can provide important information about:  Signs of coronary artery disease (CAD).  Aneurysm detection. An aneurysm is a weak or damaged part of an artery wall that bulges out from the normal force of blood pumping through the body.  Heart size and shape. Changes in the size or shape of the heart can be associated with certain conditions, including heart failure, aneurysm, and CAD.  Heart muscle function.  Heart valve function.  Signs of a past heart attack.  Fluid buildup around the heart.  Thickening of the heart muscle.  A tumor or infectious growth around the heart valves. Tell a health care provider about:  Any allergies you have.  All medicines you are taking, including vitamins, herbs, eye drops, creams, and over-the-counter medicines.  Any blood disorders you have.  Any surgeries you have had.  Any medical conditions you have.  Whether you are pregnant or may be pregnant. What are the risks? Generally, this is a safe procedure. However, problems may occur, including:  Allergic reaction to dye (contrast) that may be used during the procedure. What happens before the procedure? No specific preparation is needed. You may eat and drink normally. What happens during the procedure?   An IV tube may be inserted into one of your veins.    You may receive contrast through this tube. A contrast is an injection that improves the quality of the pictures from your heart.  A gel will be applied to your chest.  A wand-like tool (transducer) will be moved over your chest. The gel will help to transmit the sound waves from the transducer.  The sound waves will harmlessly bounce off of your heart to  allow the heart images to be captured in real-time motion. The images will be recorded on a computer. The procedure may vary among health care providers and hospitals. What happens after the procedure?  You may return to your normal, everyday life, including diet, activities, and medicines, unless your health care provider tells you not to do that. Summary  An echocardiogram is a procedure that uses painless sound waves (ultrasound) to produce an image of the heart.  Images from an echocardiogram can provide important information about the size and shape of your heart, heart muscle function, heart valve function, and fluid buildup around your heart.  You do not need to do anything to prepare before this procedure. You may eat and drink normally.  After the echocardiogram is completed, you may return to your normal, everyday life, unless your health care provider tells you not to do that. This information is not intended to replace advice given to you by your health care provider. Make sure you discuss any questions you have with your health care provider. Document Revised: 10/25/2018 Document Reviewed: 08/06/2016 Elsevier Patient Education  2020 Elsevier Inc.   

## 2020-04-27 ENCOUNTER — Other Ambulatory Visit: Payer: Self-pay

## 2020-04-27 ENCOUNTER — Ambulatory Visit (HOSPITAL_BASED_OUTPATIENT_CLINIC_OR_DEPARTMENT_OTHER)
Admission: RE | Admit: 2020-04-27 | Discharge: 2020-04-27 | Disposition: A | Discharge status: Home / Self Care | Payer: 59 | Source: Ambulatory Visit | Attending: Cardiology | Admitting: Cardiology

## 2020-04-27 DIAGNOSIS — M7989 Other specified soft tissue disorders: Secondary | ICD-10-CM | POA: Diagnosis not present

## 2020-04-27 DIAGNOSIS — R0609 Other forms of dyspnea: Secondary | ICD-10-CM

## 2020-04-27 DIAGNOSIS — R0600 Dyspnea, unspecified: Secondary | ICD-10-CM | POA: Diagnosis present

## 2020-04-27 LAB — ECHOCARDIOGRAM COMPLETE
Area-P 1/2: 3.42 cm2
S' Lateral: 2.62 cm

## 2020-06-09 ENCOUNTER — Other Ambulatory Visit: Payer: Self-pay

## 2020-06-10 ENCOUNTER — Other Ambulatory Visit: Payer: Self-pay

## 2020-06-10 ENCOUNTER — Encounter: Payer: Self-pay | Admitting: Cardiology

## 2020-06-10 ENCOUNTER — Ambulatory Visit (INDEPENDENT_AMBULATORY_CARE_PROVIDER_SITE_OTHER): Payer: 59 | Admitting: Cardiology

## 2020-06-10 VITALS — BP 108/77 | HR 98 | Ht 63.5 in | Wt 190.0 lb

## 2020-06-10 DIAGNOSIS — R0600 Dyspnea, unspecified: Secondary | ICD-10-CM | POA: Diagnosis not present

## 2020-06-10 DIAGNOSIS — G4719 Other hypersomnia: Secondary | ICD-10-CM

## 2020-06-10 DIAGNOSIS — E039 Hypothyroidism, unspecified: Secondary | ICD-10-CM

## 2020-06-10 DIAGNOSIS — E785 Hyperlipidemia, unspecified: Secondary | ICD-10-CM | POA: Diagnosis not present

## 2020-06-10 DIAGNOSIS — M7989 Other specified soft tissue disorders: Secondary | ICD-10-CM

## 2020-06-10 DIAGNOSIS — R0609 Other forms of dyspnea: Secondary | ICD-10-CM

## 2020-06-10 NOTE — Patient Instructions (Signed)
Medication Instructions:  Your physician recommends that you continue on your current medications as directed. Please refer to the Current Medication list given to you today.  *If you need a refill on your cardiac medications before your next appointment, please call your pharmacy*   Lab Work: Your physician recommends that you return for lab work today: bmp   If you have labs (blood work) drawn today and your tests are completely normal, you will receive your results only by: Marland Kitchen MyChart Message (if you have MyChart) OR . A paper copy in the mail If you have any lab test that is abnormal or we need to change your treatment, we will call you to review the results.   Testing/Procedures: Your physician has recommended that you have a sleep study. This test records several body functions during sleep, including: brain activity, eye movement, oxygen and carbon dioxide blood levels, heart rate and rhythm, breathing rate and rhythm, the flow of air through your mouth and nose, snoring, body muscle movements, and chest and belly movement.     Follow-Up: At [facility], you and your health needs are our priority.  As part of our continuing mission to provide you with exceptional heart care, we have created designated Provider Care Teams.  These Care Teams include your primary Cardiologist (physician) and Advanced Practice Providers (APPs -  Physician Assistants and Nurse Practitioners) who all work together to provide you with the care you need, when you need it.  We recommend signing up for the patient portal called "MyChart".  Sign up information is provided on this After Visit Summary.  MyChart is used to connect with patients for Virtual Visits (Telemedicine).  Patients are able to view lab/test results, encounter notes, upcoming appointments, etc.  Non-urgent messages can be sent to your provider as well.   To learn more about what you can do with MyChart, go to [email or web address].     Your next appointment:   5 month(s)  The format for your next appointment:   In Person  Provider:   Gypsy Balsam, MD   Other Instructions

## 2020-06-10 NOTE — Progress Notes (Signed)
Cardiology Office Note:    Date:  06/10/2020   ID:  Hayley Mason, Hayley Mason 1976/01/27, MRN [id number]  PCP:  Marylen Ponto, PA-C  Cardiologist:  Gypsy Balsam, MD    Referring MD: Marylen Ponto, PA-C   Chief Complaint  Patient presents with  . Follow-up  I am doing better  History of Present Illness:    Hayley Mason is a 44 y.o. female she did have Covid infection few months ago she was sent to [place] because of shortness of breath as well as swelling of lower extremities and elevated proBNP.  Since the time I seen her last time she is doing better her ability to exercise is better swelling of lower extremities still present she takes 20 mg of furosemide sometimes 40.  She is to take potassium however that her stomach is stopped doing it.  Denies have any chest pain tightness squeezing pressure burning chest.  Past Medical History:  Diagnosis Date  . Acquired hypothyroidism 04/03/2017  . 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 COVID-19 03/15/2020   Last Assessment & Plan:  Formatting of this note might be different from the original. Mab infusion consent done.  . Dyslipidemia 04/06/2020  . Dyspnea on exertion 04/06/2020  . Swelling of lower extremity 04/06/2020    Past Surgical History:  Procedure Laterality Date  . NO PAST SURGERIES      Current Medications: Current Meds  Medication Sig  . acetaminophen (TYLENOL) 500 MG tablet Take 500 mg by mouth every 6 (six) hours as needed for mild pain.  Marland Kitchen amphetamine-dextroamphetamine (ADDERALL) 20 MG tablet Take 20 mg by mouth daily.  Marland Kitchen aspirin EC 81 MG tablet Take 81 mg by mouth daily. Swallow whole.  . cetirizine (ZYRTEC) 10 MG tablet Take 10 mg by mouth every evening.  . diphenhydrAMINE (BENADRYL) 25 MG tablet Take 25 mg by mouth every 6 (six) hours as needed (Allergic reaction).  . furosemide (LASIX) 20 MG tablet Take 20 mg by mouth. 1- 2 times a day  . ibuprofen (ADVIL) 200 MG tablet Take 200 mg by  mouth every 6 (six) hours as needed for moderate pain.  Marland Kitchen liothyronine (CYTOMEL) 5 MCG tablet Take 15 mcg by mouth daily.  . Naltrexone POWD Take 1 tablet by mouth every evening. 2 mg tablet per patient  . ondansetron (ZOFRAN-ODT) 4 MG disintegrating tablet Take 4 mg by mouth as needed.  Marland Kitchen TIROSINT 75 MCG CAPS Take 75 mcg by mouth every morning.  . topiramate (TOPAMAX) 25 MG capsule Take 25 mg by mouth every evening.  . Vitamin D-Vitamin K (VITAMIN K2-VITAMIN D3) 45-2000 MCG-UNIT CAPS Take 1 tablet by mouth every evening.     Allergies:   Codeine   Social History   Socioeconomic History  . Marital status: Married    Spouse name: Not on file  . Number of children: Not on file  . Years of education: Not on file  . Highest education level: Not on file  Occupational History  . Not on file  Tobacco Use  . Smoking status: Former Games developer  . Smokeless tobacco: Never Used  Substance and Sexual Activity  . Alcohol use: Not on file  . Drug use: Not on file  . Sexual activity: Not on file  Other Topics Concern  . Not on file  Social History Narrative  . Not on file   Social Determinants of Health   Financial Resource Strain:   . Difficulty of Paying Living Expenses:  Not on file  Food Insecurity:   . Worried About Programme researcher, broadcasting/film/video in the Last Year: Not on file  . Ran Out of Food in the Last Year: Not on file  Transportation Needs:   . Lack of Transportation (Medical): Not on file  . Lack of Transportation (Non-Medical): Not on file  Physical Activity:   . Days of Exercise per Week: Not on file  . Minutes of Exercise per Session: Not on file  Stress:   . Feeling of Stress : Not on file  Social Connections:   . Frequency of Communication with Friends and Family: Not on file  . Frequency of Social Gatherings with Friends and Family: Not on file  . Attends Religious Services: Not on file  . Active Member of Clubs or Organizations: Not on file  . Attends Banker  Meetings: Not on file  . Marital Status: Not on file     Family History: The patient's family history includes Breast cancer in her sister; Congestive Heart Failure in her maternal grandfather; Hypertension in her mother. ROS:   Please see the history of present illness.    All 14 point review of systems negative except as described per history of present illness  EKGs/Labs/Other Studies Reviewed:      Recent Labs: 02/28/2020: ALT 12; BUN 16; Creatinine, Ser 1.20; Hemoglobin 11.2; Platelets 440; Potassium 3.5; Sodium 140 02/29/2020: B Natriuretic Peptide 21.3  Recent Lipid Panel No results found for: CHOL, TRIG, HDL, CHOLHDL, VLDL, LDLCALC, LDLDIRECT  Physical Exam:    VS:  BP 108/77 (BP Location: Left Arm, Patient Position: Sitting, Cuff Size: Normal)   Pulse 98   Ht 5' 3.5" (1.613 m)   Wt 190 lb (86.2 kg)   SpO2 100%   BMI 33.13 kg/m     Wt Readings from Last 3 Encounters:  06/10/20 190 lb (86.2 kg)  04/06/20 194 lb (88 kg)  02/28/20 200 lb (90.7 kg)     GEN:  Well nourished, well developed in no acute distress HEENT: Normal NECK: No JVD; No carotid bruits LYMPHATICS: No lymphadenopathy CARDIAC: RRR, no murmurs, no rubs, no gallops RESPIRATORY:  Clear to auscultation without rales, wheezing or rhonchi  ABDOMEN: Soft, non-tender, non-distended MUSCULOSKELETAL:  No edema; No deformity  SKIN: Warm and dry LOWER EXTREMITIES: 1+ swelling NEUROLOGIC:  Alert and oriented x 3 PSYCHIATRIC:  Normal affect   ASSESSMENT:    1. Swelling of lower extremity   2. Dyspnea on exertion   3. Dyslipidemia   4. Acquired hypothyroidism    PLAN:    In order of problems listed above:  1. Swelling of lower extremities.  Echocardiogram reviewed showed normal left ventricle systolic function, diastolic dysfunction stage 1, no explanation for for her swelling of lower extremities.  I did review her charts today. 2. Dyspnea on exertion: Improved I suspect this is still post  Covid. 3. Dyslipidemia: I do not have her fasting lipid profile we will try to do it. 4. Hypothyroidism: She is telling me that she had endocrinologist appointment on 8 December to follow-up on that which I think is an excellent idea since hypothyroidism can also contribute to swelling of lower extremities 5. Sleep apnea possibility she said that she snores quite a bit with swelling of lower extremities and to make sure we do not have significant sleep apnea.  We will schedule her to have a sleep study   Medication Adjustments/Labs and Tests Ordered: Current medicines are reviewed at length with  the patient today.  Concerns regarding medicines are outlined above.  No orders of the defined types were placed in this encounter.  Medication changes: No orders of the defined types were placed in this encounter.   Signed, Georgeanna Lea, MD, [facility] 06/10/2020 4:32 PM    [facility]

## 2020-06-11 LAB — BASIC METABOLIC PANEL
BUN/Creatinine Ratio: 13 (ref 9–23)
BUN: 12 mg/dL (ref 6–24)
CO2: 24 mmol/L (ref 20–29)
Calcium: 9.3 mg/dL (ref 8.7–10.2)
Chloride: 102 mmol/L (ref 96–106)
Creatinine, Ser: 0.91 mg/dL (ref 0.57–1.00)
GFR calc Af Amer: 88 mL/min/{1.73_m2} (ref >59)
GFR calc non Af Amer: 76 mL/min/{1.73_m2} (ref >59)
Glucose: 93 mg/dL (ref 65–99)
Potassium: 4.3 mmol/L (ref 3.5–5.2)
Sodium: 139 mmol/L (ref 134–144)

## 2020-06-15 ENCOUNTER — Telehealth: Payer: Self-pay | Admitting: Cardiology

## 2020-06-15 NOTE — Telephone Encounter (Signed)
Patient returning Hayley's call for results, connected call to Hayley  

## 2020-06-15 NOTE — Telephone Encounter (Signed)
Patient informed of results.  

## 2020-06-17 NOTE — Addendum Note (Signed)
Addended by: Hazle Quant on: 06/17/2020 10:04 AM   Modules accepted: Orders

## 2020-07-03 ENCOUNTER — Telehealth: Payer: Self-pay | Admitting: Cardiology

## 2020-07-03 NOTE — Telephone Encounter (Signed)
Changed split night to HST, so no PA is required.  Waiting on sleep lab to call back to schedule.

## 2020-07-06 NOTE — Telephone Encounter (Signed)
Called and left message that she is scheduled for Jan 17 at 2:30 pm, to expect info packet and left the sleep lab number.

## 2020-08-03 ENCOUNTER — Encounter (HOSPITAL_BASED_OUTPATIENT_CLINIC_OR_DEPARTMENT_OTHER): Payer: 59 | Admitting: Cardiovascular Disease

## 2020-11-09 ENCOUNTER — Ambulatory Visit: Payer: 59 | Admitting: Cardiology

## 2020-12-05 IMAGING — CR DG CHEST 2V
2 series · 2 of 2 positions shown · non-contrast
Comparison: None.

CLINICAL DATA: Fever. Recently diagnosed with Mayeli Hardee
Gemini fever

EXAM:
CHEST - 2 VIEW

[w chest pa]
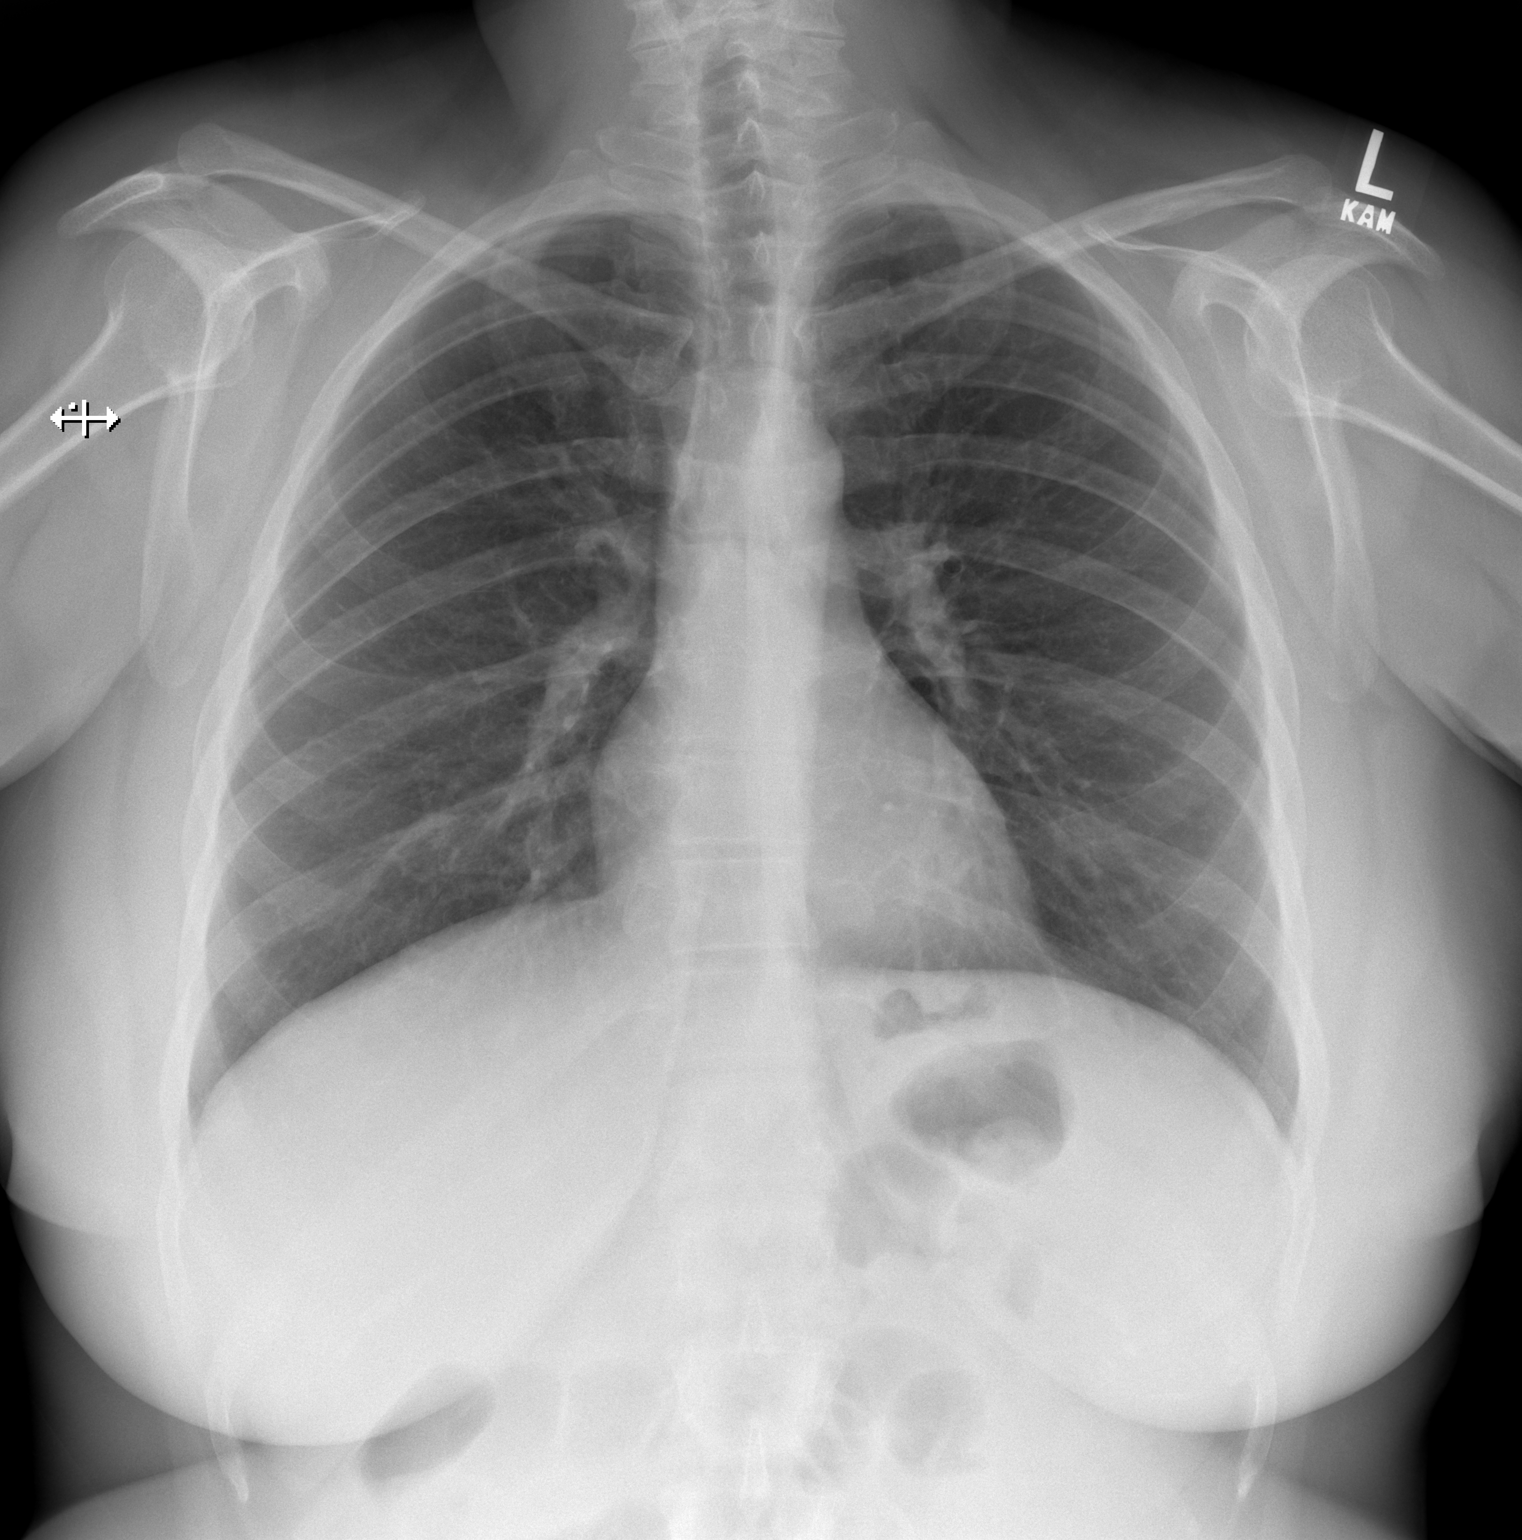

[w chest lat]
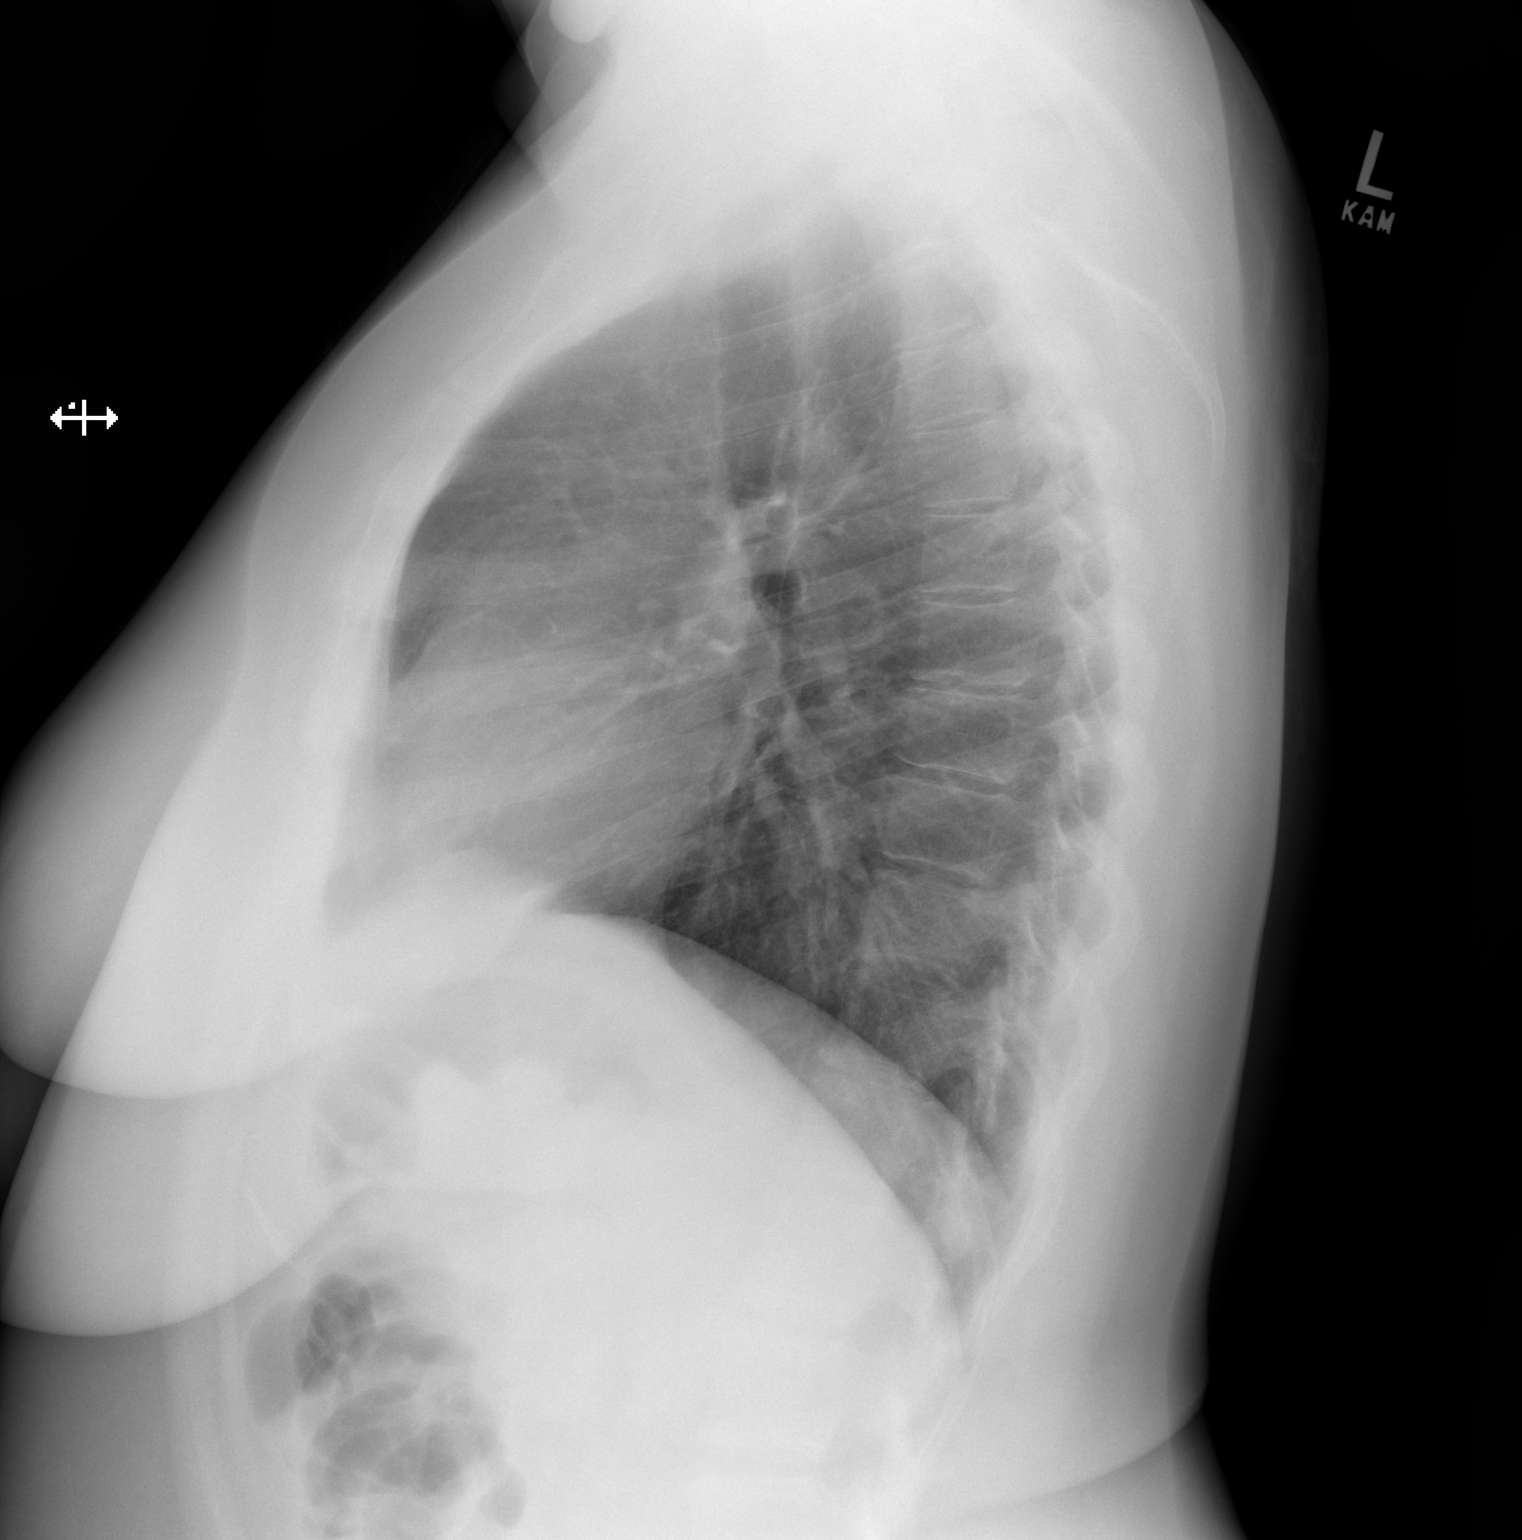

[2 of 2 positions shown; findings below may reference images not displayed]

FINDINGS: Lungs are clear. Heart size and pulmonary vascularity are normal. No
adenopathy. No bone lesions.
IMPRESSION: No edema or consolidation.  No evident adenopathy.

## 2021-02-24 ENCOUNTER — Other Ambulatory Visit (HOSPITAL_COMMUNITY): Payer: Self-pay | Admitting: Family Medicine

## 2021-02-24 DIAGNOSIS — Z1231 Encounter for screening mammogram for malignant neoplasm of breast: Secondary | ICD-10-CM

## 2021-03-03 ENCOUNTER — Ambulatory Visit (HOSPITAL_COMMUNITY)
Admission: RE | Admit: 2021-03-03 | Discharge: 2021-03-03 | Disposition: A | Discharge status: Home / Self Care | Payer: No Typology Code available for payment source | Source: Ambulatory Visit | Attending: Family Medicine | Admitting: Family Medicine

## 2021-03-03 ENCOUNTER — Other Ambulatory Visit: Payer: Self-pay

## 2021-03-03 DIAGNOSIS — Z1231 Encounter for screening mammogram for malignant neoplasm of breast: Secondary | ICD-10-CM | POA: Insufficient documentation

## 2021-03-10 ENCOUNTER — Ambulatory Visit (HOSPITAL_COMMUNITY): Payer: Self-pay

## 2021-10-05 IMAGING — CT CT ANGIO CHEST
2 of 7 series · 19 of 46 positions shown · IV contrast (omnipaque)
Comparison: None.

CLINICAL DATA: Positive D-dimer

EXAM:
CT ANGIOGRAPHY CHEST WITH CONTRAST
TECHNIQUE: Multidetector CT imaging of the chest was performed using the
standard protocol during bolus administration of intravenous
contrast. Multiplanar CT image reconstructions and MIPs were
obtained to evaluate the vascular anatomy.
CONTRAST:  75mL OMNIPAQUE IOHEXOL 350 MG/ML SOLN

[Series 6: thins · axial · 0.87mm/px · z∈[+114,+374]mm · 16 of 292 slices shown]
[im 16/292  lung]
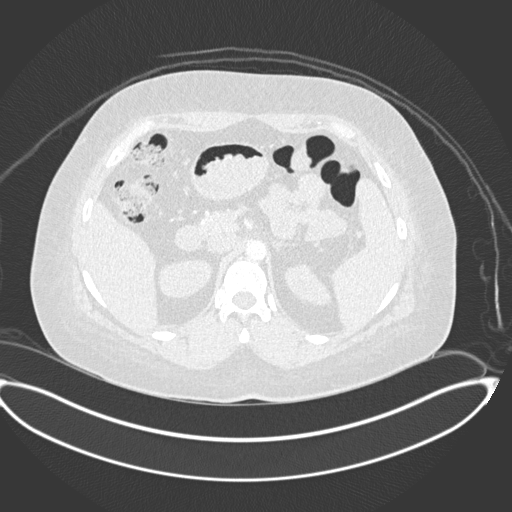
[im 31/292  soft-tissue]
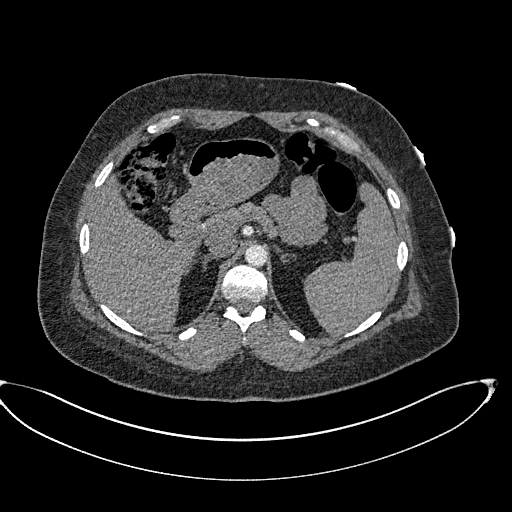
[im 46/292  lung]
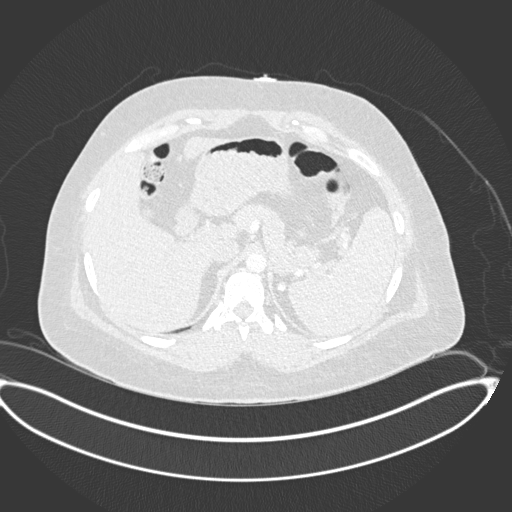
[im 62/292  soft-tissue]
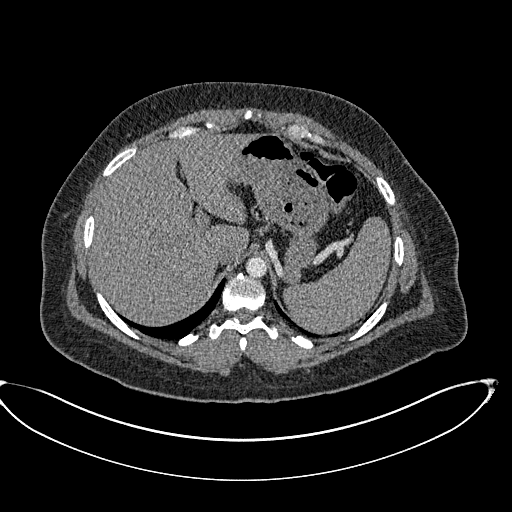
[im 92/292  lung]
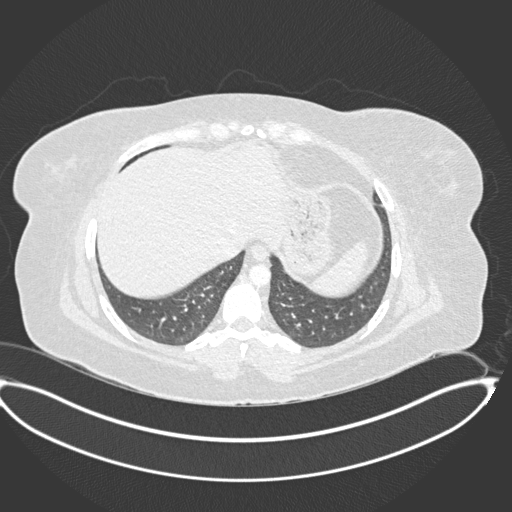
[im 108/292  soft-tissue]
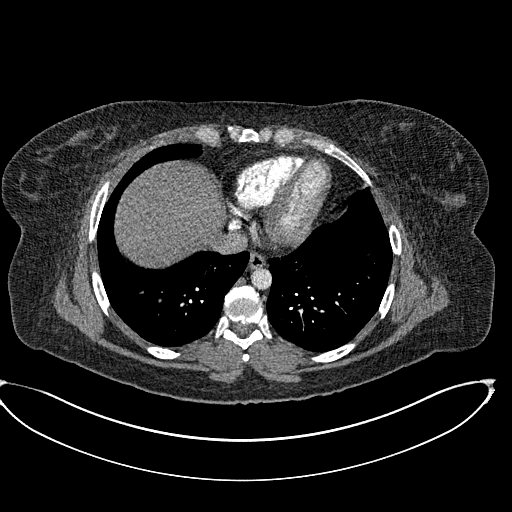
[im 123/292  lung]
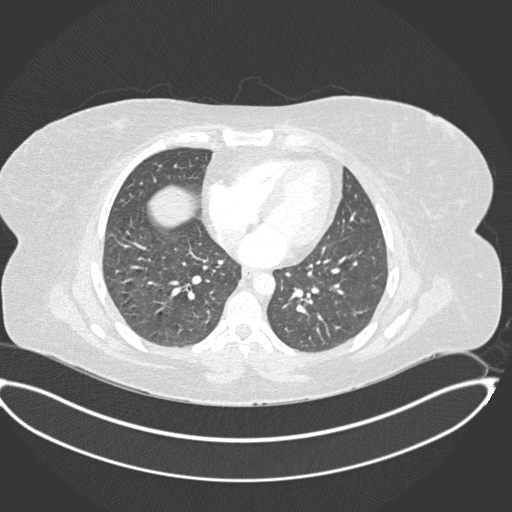
[im 138/292  soft-tissue]
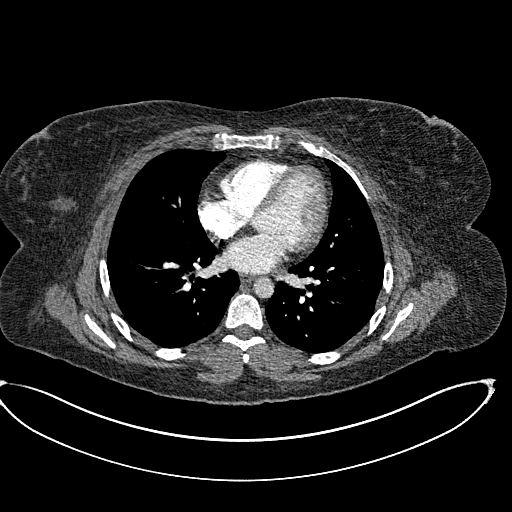
[im 154/292  lung]
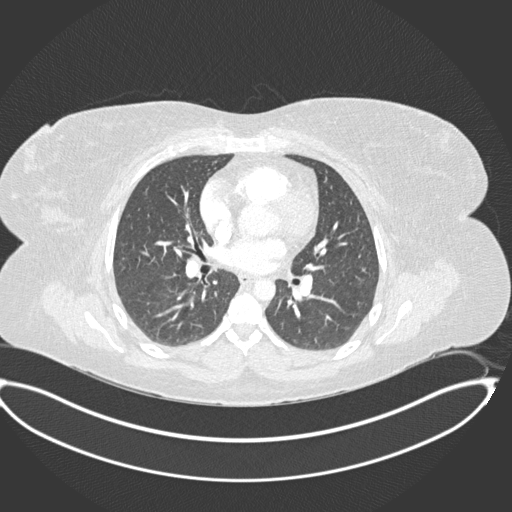
[im 169/292  soft-tissue]
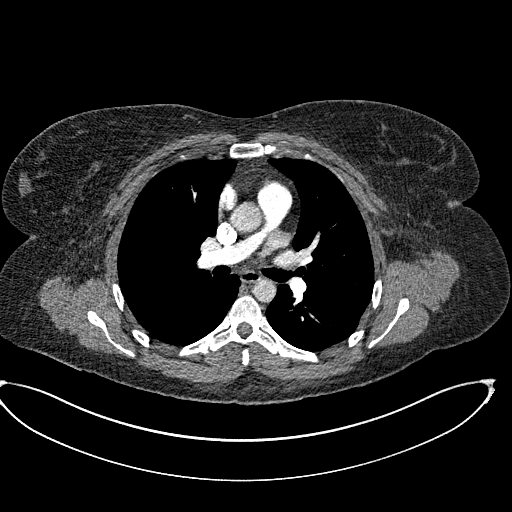
[im 184/292  lung]
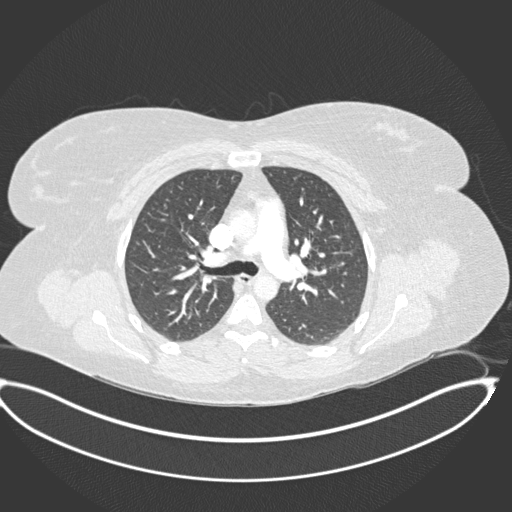
[im 200/292  soft-tissue]
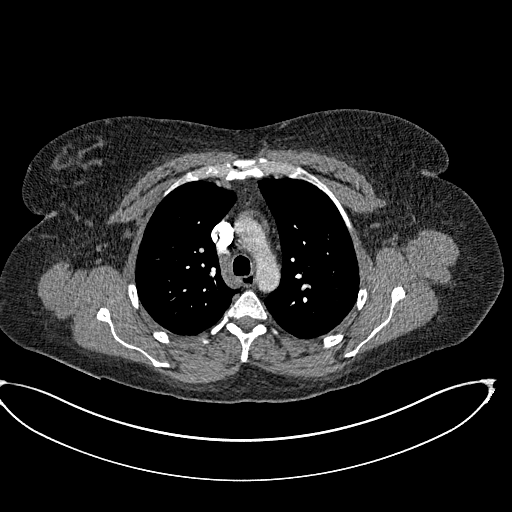
[im 230/292  lung]
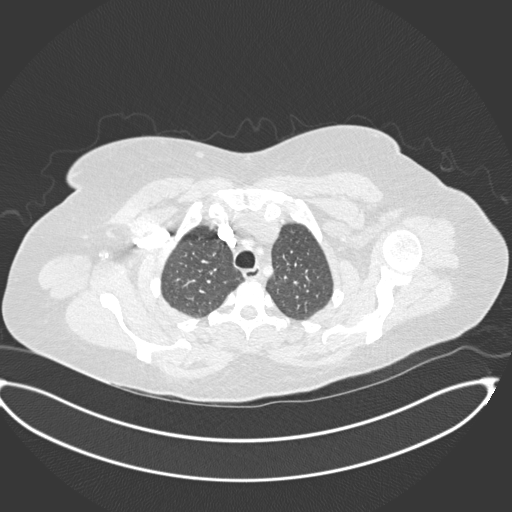
[im 246/292  soft-tissue]
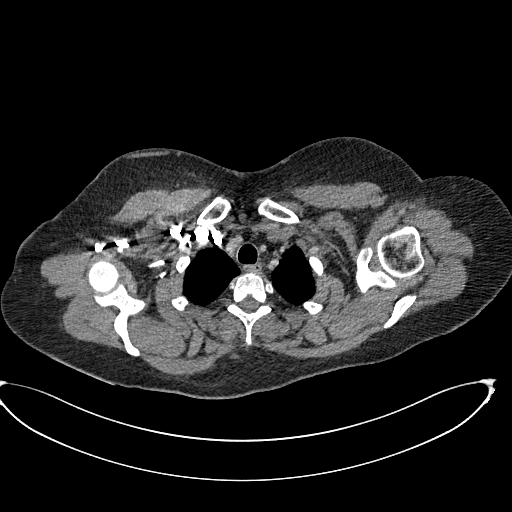
[im 261/292  lung]
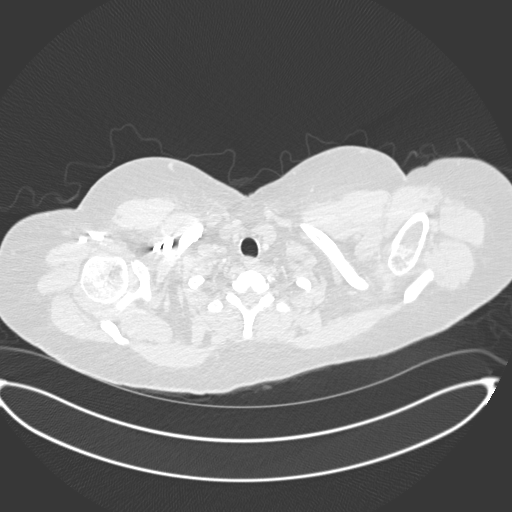
[im 276/292  soft-tissue]
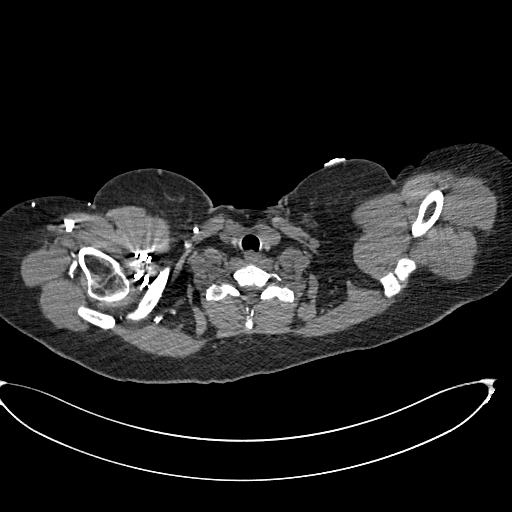

[Series 8: coronal mpr · coronal · 0.59mm/px · 3 of 151 slices shown]
[im 38/151  soft-tissue]
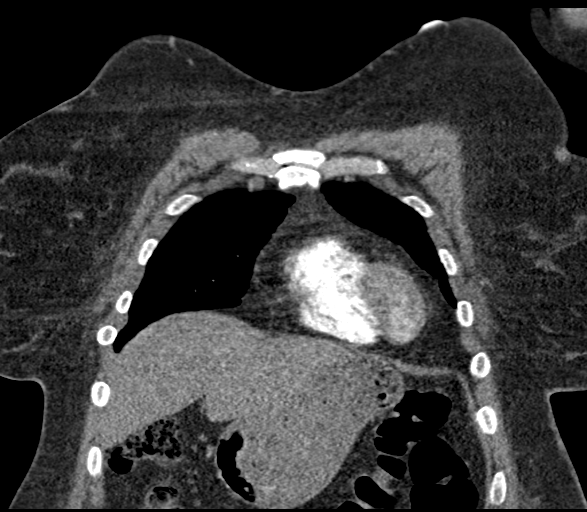
[im 76/151  soft-tissue]
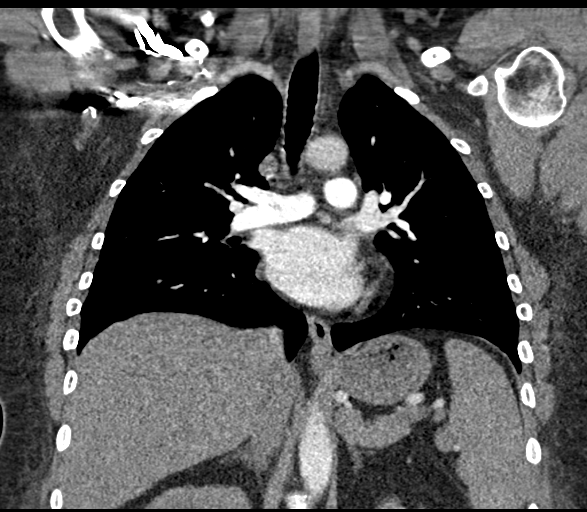
[im 113/151  soft-tissue]
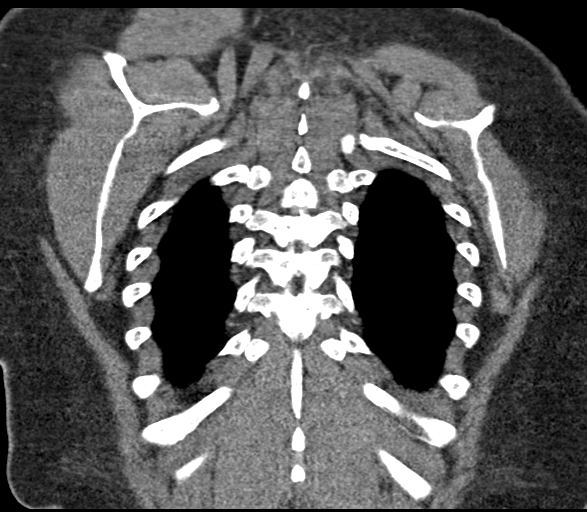

[19 of 46 positions shown; findings below may reference images not displayed]

FINDINGS: Cardiovascular: No filling defects in the pulmonary arteries to
suggest pulmonary emboli. Heart is normal size. Aorta is normal
caliber.

Mediastinum/Nodes: No mediastinal, hilar, or axillary adenopathy.
Trachea and esophagus are unremarkable. Thyroid unremarkable.

Lungs/Pleura: Lungs are clear. No focal airspace opacities or
suspicious nodules. No effusions.

Upper Abdomen: Imaging into the upper abdomen demonstrates no acute
findings.

Musculoskeletal: Chest wall soft tissues are unremarkable. No acute
bony abnormality.

Review of the MIP images confirms the above findings.
IMPRESSION: No evidence of pulmonary embolus.

No acute cardiopulmonary disease.
